# Patient Record
Sex: Male | Born: 1965 | Hispanic: Yes | Marital: Married | Smoking: Never smoker
Health system: Southern US, Community
[De-identification: ages and names within clinical notes are randomized; demographics above are authoritative.]

## PROBLEM LIST (undated history)

## (undated) DIAGNOSIS — E7800 Pure hypercholesterolemia, unspecified: Secondary | ICD-10-CM

## (undated) HISTORY — DX: Pure hypercholesterolemia, unspecified: E78.00

---

## 2013-08-13 ENCOUNTER — Emergency Department: Payer: Self-pay

## 2020-02-10 ENCOUNTER — Other Ambulatory Visit: Payer: Self-pay | Admitting: Orthopedic Surgery

## 2020-02-10 DIAGNOSIS — M4802 Spinal stenosis, cervical region: Secondary | ICD-10-CM

## 2020-02-10 DIAGNOSIS — M5030 Other cervical disc degeneration, unspecified cervical region: Secondary | ICD-10-CM

## 2020-03-12 ENCOUNTER — Ambulatory Visit
Admission: RE | Admit: 2020-03-12 | Discharge: 2020-03-12 | Disposition: A | Discharge status: Home / Self Care | Payer: No Typology Code available for payment source | Source: Ambulatory Visit | Attending: Orthopedic Surgery | Admitting: Orthopedic Surgery

## 2020-03-12 DIAGNOSIS — M5030 Other cervical disc degeneration, unspecified cervical region: Secondary | ICD-10-CM

## 2020-03-12 DIAGNOSIS — M4802 Spinal stenosis, cervical region: Secondary | ICD-10-CM

## 2020-03-12 MED ORDER — GADOBENATE DIMEGLUMINE 529 MG/ML IV SOLN: 20.0000 mL | Freq: Once | INTRAVENOUS | Status: DC | PRN

## 2020-03-12 MED ORDER — GADOBENATE DIMEGLUMINE 529 MG/ML IV SOLN
20.0000 mL | Freq: Once | INTRAVENOUS | Status: AC | PRN
  Administered 2020-03-12: 20 mL via INTRAVENOUS

## 2020-03-19 DIAGNOSIS — M4802 Spinal stenosis, cervical region: Secondary | ICD-10-CM | POA: Insufficient documentation

## 2020-03-19 DIAGNOSIS — D171 Benign lipomatous neoplasm of skin and subcutaneous tissue of trunk: Secondary | ICD-10-CM | POA: Insufficient documentation

## 2021-01-17 IMAGING — MR MR CERVICAL SPINE WO/W CM
5 of 8 series · 27 of 48 positions shown · IV contrast (multihance)
Comparison: 01/03/2020 cervical spine radiographs.

CLINICAL DATA: Right shoulder and arm pain for 8 months.

EXAM:
MRI CERVICAL SPINE WITHOUT AND WITH CONTRAST
TECHNIQUE: Multiplanar and multiecho pulse sequences of the cervical spine, to
include the craniocervical junction and cervicothoracic junction,
were obtained without and with intravenous contrast.
CONTRAST:  20mL MULTIHANCE GADOBENATE DIMEGLUMINE 529 MG/ML IV SOLN

[Series 5: T1 · sagittal · 3.0mm · 0.66mm/px · 3 of 13 slices shown (1 of 3)]
[im 1/13]
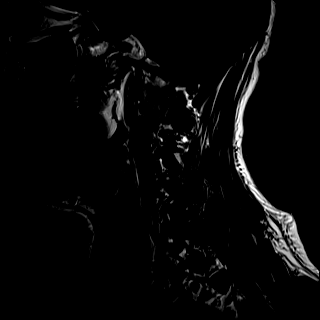
[im 7/13]
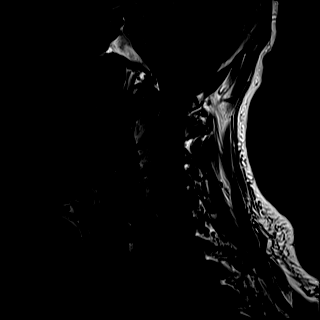
[im 13/13]
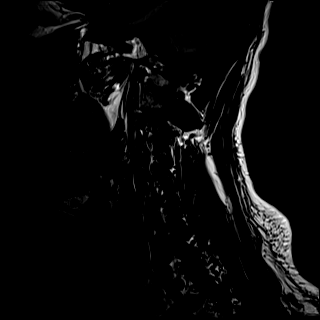

[Series 7: T2 · axial · 3.0mm · 0.50mm/px · z∈[-41,+62]mm · 9 of 34 slices shown (1 of 2)]
[im 1/34]
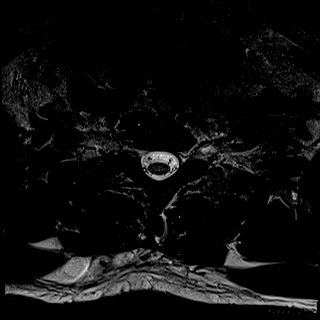
[im 5/34]
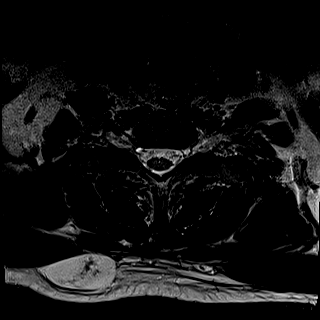
[im 9/34]
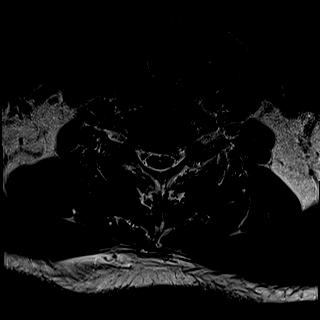
[im 13/34]
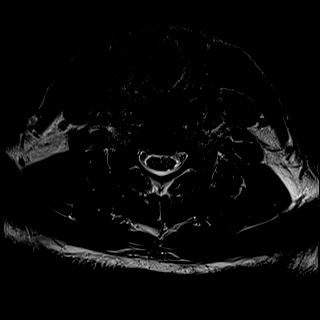
[im 17/34]
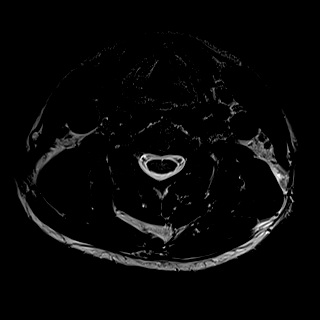
[im 21/34]
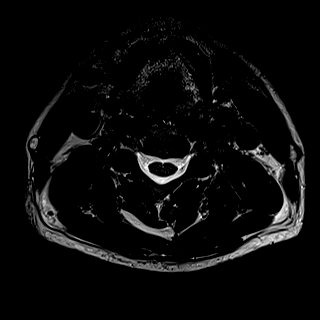
[im 25/34]
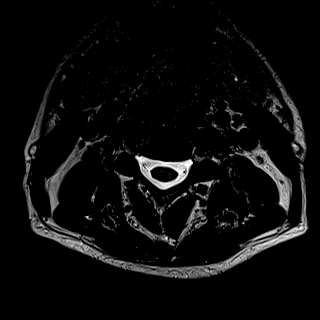
[im 29/34]
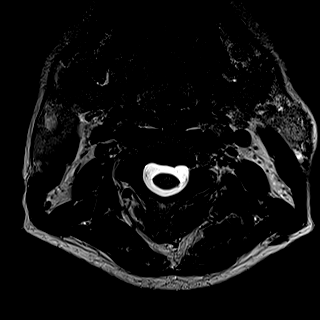
[im 34/34]
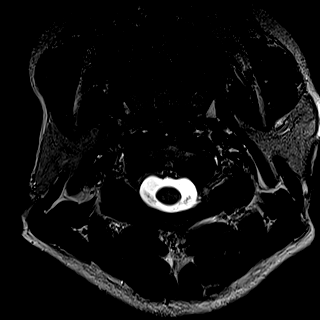

[Series 9: T1 · axial · non-contrast · 3.0mm · 0.31mm/px · z∈[-41,+62]mm · 9 of 34 slices shown (2 of 3)]
[im 1/34]
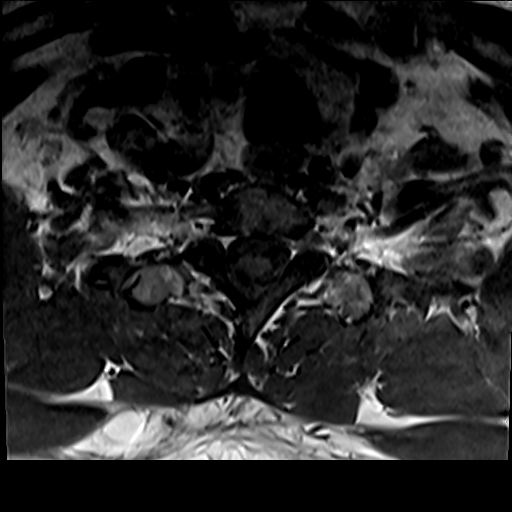
[im 5/34]
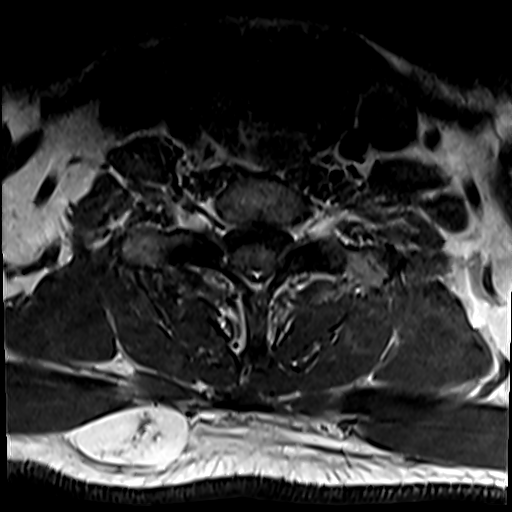
[im 9/34]
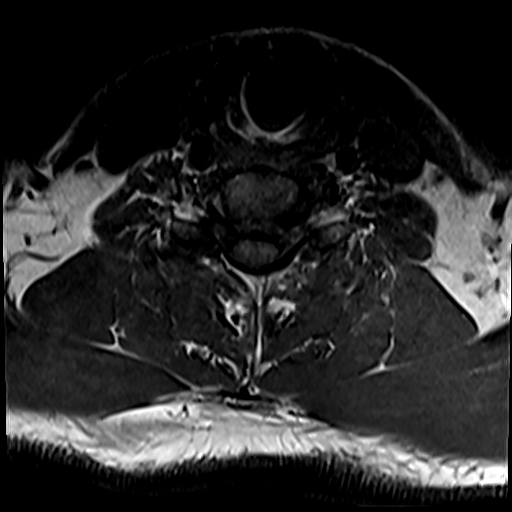
[im 13/34]
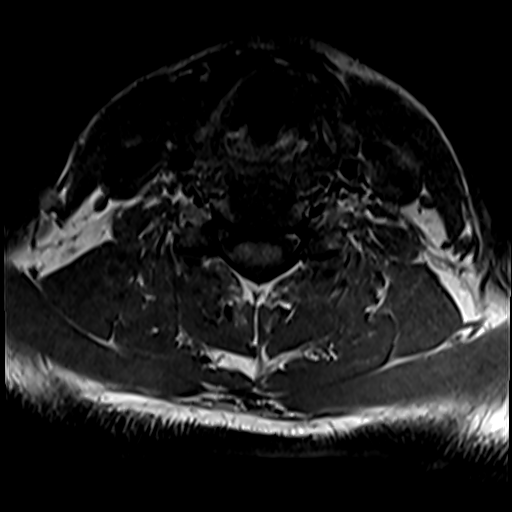
[im 17/34]
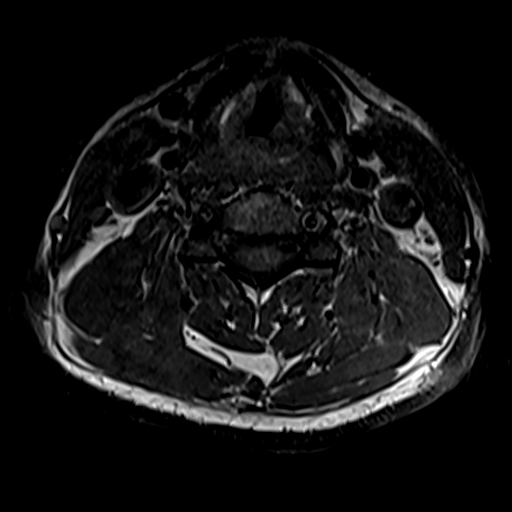
[im 21/34]
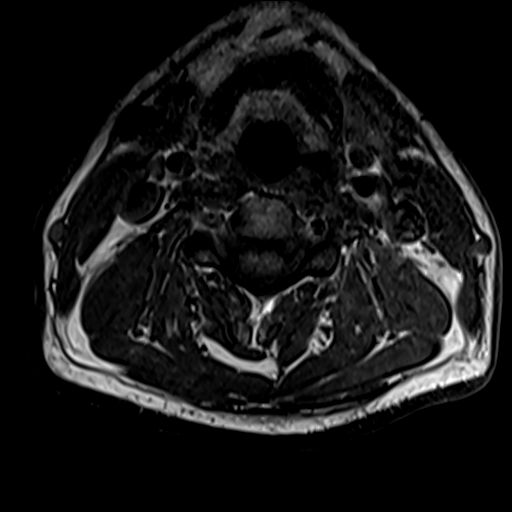
[im 25/34]
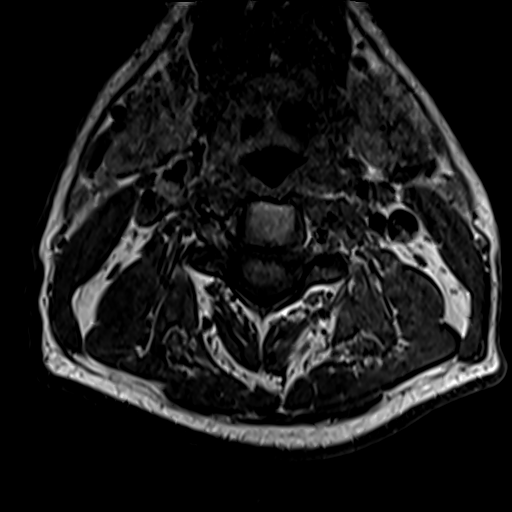
[im 29/34]
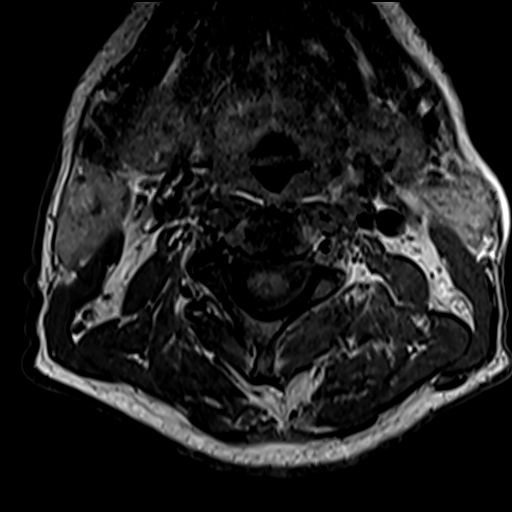
[im 34/34]
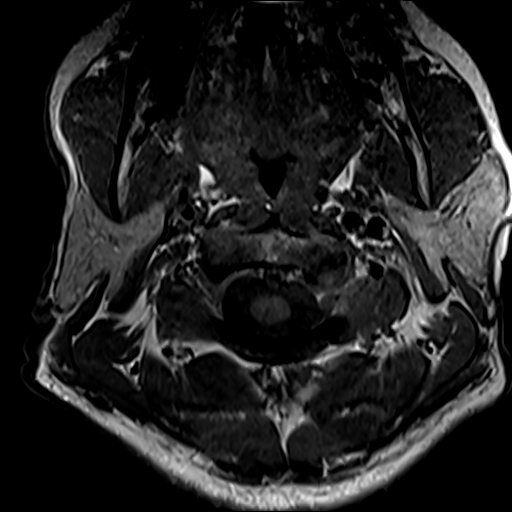

[Series 10: T2 · sagittal · 3.0mm · 0.55mm/px · 3 of 13 slices shown (2 of 2)]
[im 1/13]
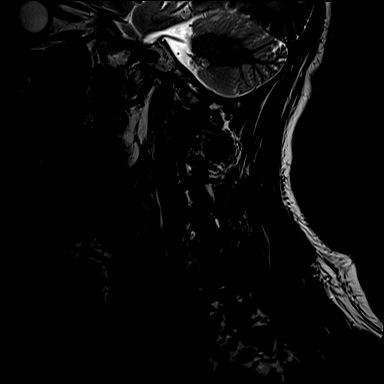
[im 7/13]
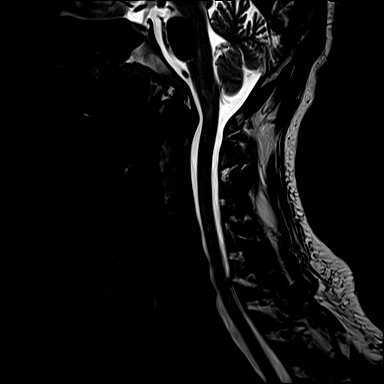
[im 13/13]
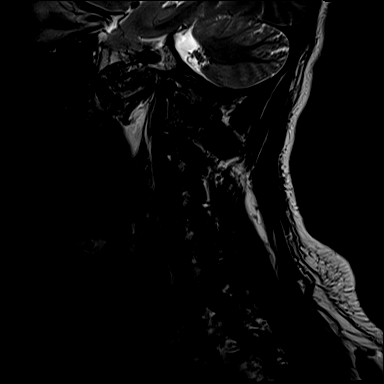

[Series 12: T1 · axial · 3.0mm · 0.31mm/px · z∈[-41,-16]mm · 3 of 34 slices shown (3 of 3)]
[im 1/34]
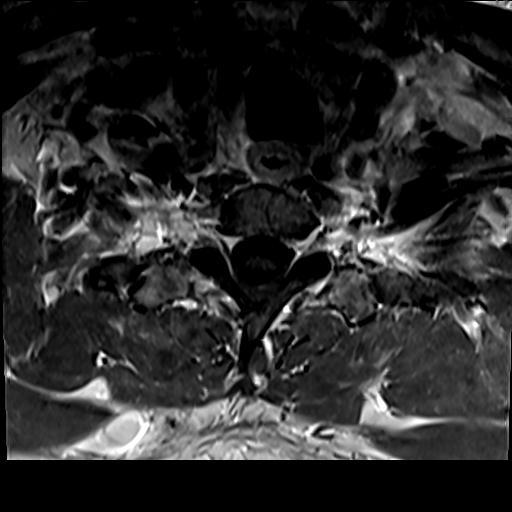
[im 5/34]
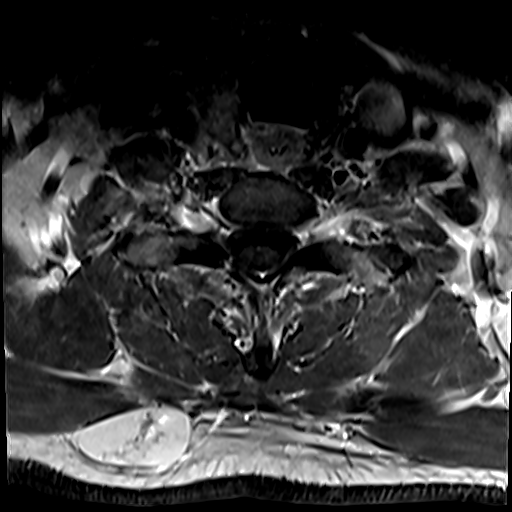
[im 9/34]
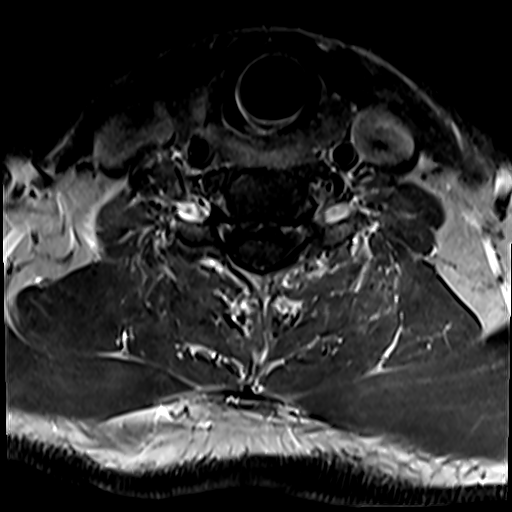

[27 of 48 positions shown; findings below may reference images not displayed]

FINDINGS: Alignment: Straightening of cervical lordosis.

Vertebrae: Normal bone marrow signal intensity. No focal osseous
lesion.

Cord: Normal signal and morphology.

Posterior Fossa, vertebral arteries: Negative.

Disc levels: Multilevel desiccation with mild disc space loss at the
C5-7 levels.

C2-3: No significant disc bulge, spinal canal or neural foraminal
narrowing.

C3-4: Disc osteophyte complex with superimposed central protrusion
and uncovertebral hypertrophy. No significant spinal canal or neural
foraminal narrowing.

C4-5: Disc osteophyte complex with superimposed central protrusion,
uncovertebral and bilateral facet hypertrophy. Mild spinal canal and
bilateral neural foraminal narrowing.

C5-6: Disc osteophyte complex with superimposed right paracentral
and subarticular protrusions with uncovertebral and bilateral facet
hypertrophy. Mild spinal canal, moderate right and mild left neural
foraminal narrowing.

C6-7: Disc osteophyte complex with small superimposed central
protrusion, uncovertebral and bilateral facet hypertrophy. Moderate
spinal canal and bilateral neural foraminal narrowing.

C7-T1: No significant disc bulge, spinal canal or neural foraminal
narrowing.

Paraspinal tissues: Dorsal right thoracic lipoma measuring 3.8 x
cm ([DATE]) at the C7-T1 level. Small nasopharyngeal retention cyst.
IMPRESSION: Multilevel spondylosis with moderate C6-7 and mild C4-6 spinal canal
narrowing.

Moderate right C5-6 and bilateral C6-7 neural foraminal narrowing.

## 2021-04-10 LAB — EXTERNAL GENERIC LAB PROCEDURE: COLOGUARD: POSITIVE — AB

## 2021-12-29 ENCOUNTER — Encounter: Payer: Self-pay | Admitting: Gastroenterology

## 2021-12-29 NOTE — H&P (Signed)
? ?Pre-Procedure H&P ? ?Patient ID: Nathan Conner is a 56 y.o. male. ? ?Gastroenterology Provider: Jaynie Collins, DO ? ?PCP: [facility] ? ?Date: 12/30/2021 ? ?HPI ?Nathan Conner is a 56 y.o. male who presents today for Colonoscopy for Positive Cologuard. ?Patient is native [place] speaking.  Information garnered through Spanish medical interpreter. ?Patient is endoscopy na?ve.  Was noted to have positive Cologuard in July 2022.  Reports BM 1-2 times daily without blood melena hematochezia diarrhea or constipation.  Has weight gain.  Appetite stable. ?Hemoglobin 16 MCV 84 platelets 245,000 creatinine 0.9 ?No family history of colorectal cancer or polyps ? ?History reviewed. No pertinent past medical history. ? ?History reviewed. No pertinent surgical history. ? ?Family History ?No h/o GI disease or malignancy ? ?Review of Systems  ?Constitutional:  Negative for activity change, appetite change, chills, diaphoresis, fatigue, fever and unexpected weight change.  ?HENT:  Negative for trouble swallowing and voice change.   ?Respiratory:  Negative for shortness of breath and wheezing.   ?Cardiovascular:  Negative for chest pain, palpitations and leg swelling.  ?Gastrointestinal:  Negative for abdominal distention, abdominal pain, anal bleeding, blood in stool, constipation, diarrhea, nausea and vomiting.  ?Musculoskeletal:  Negative for arthralgias and myalgias.  ?Skin:  Negative for color change and pallor.  ?Neurological:  Negative for dizziness, syncope and weakness.  ?Psychiatric/Behavioral:  Negative for confusion. The patient is not nervous/anxious.   ?All other systems reviewed and are negative.  ? ?Medications ?No current facility-administered medications on file prior to encounter.  ? ?Current Outpatient Medications on File Prior to Encounter  ?Medication Sig Dispense Refill  ? Ascorbic Acid (VITAMIN C) 1000 MG tablet Take 1,000 mg by mouth daily.    ? atorvastatin (LIPITOR)  40 MG tablet Take 40 mg by mouth daily.    ? ? ?Pertinent medications related to GI and procedure were reviewed by me with the patient prior to the procedure ? ? ?Current Facility-Administered Medications:  ?  0.9 %  sodium chloride infusion, , Intravenous, Continuous, Jaynie Collins, DO, Last Rate: 20 mL/hr at 12/30/21 0948, New Bag at 12/30/21 0948 ?  ?  ? ?No Known Allergies ?Allergies were reviewed by me prior to the procedure ? ?Objective  ? ?Body mass index is 27.47 kg/m?. ?Vitals:  ? 12/30/21 0931  ?BP: (!) 163/102  ?Pulse: 69  ?Resp: 15  ?Temp: (!) 97 ?F (36.1 ?C)  ?TempSrc: Temporal  ?SpO2: 100%  ?Weight: 84.4 kg  ?Height: 5\' 9"  (1.753 m)  ? ? ? ?Physical Exam ?Vitals and nursing note reviewed.  ?Constitutional:   ?   General: He is not in acute distress. ?   Appearance: Normal appearance. He is not ill-appearing, toxic-appearing or diaphoretic.  ?HENT:  ?   Head: Normocephalic and atraumatic.  ?   Nose: Nose normal.  ?   Mouth/Throat:  ?   Mouth: Mucous membranes are moist.  ?   Pharynx: Oropharynx is clear.  ?Eyes:  ?   General: No scleral icterus. ?   Extraocular Movements: Extraocular movements intact.  ?Cardiovascular:  ?   Rate and Rhythm: Normal rate and regular rhythm.  ?   Heart sounds: Normal heart sounds. No murmur heard. ?  No friction rub. No gallop.  ?Pulmonary:  ?   Effort: Pulmonary effort is normal. No respiratory distress.  ?   Breath sounds: Normal breath sounds. No wheezing, rhonchi or rales.  ?Abdominal:  ?   General: Bowel sounds are normal. There  is no distension.  ?   Palpations: Abdomen is soft.  ?   Tenderness: There is no abdominal tenderness. There is no guarding or rebound.  ?Musculoskeletal:  ?   Cervical back: Neck supple.  ?   Right lower leg: No edema.  ?   Left lower leg: No edema.  ?Skin: ?   General: Skin is warm and dry.  ?   Coloration: Skin is not jaundiced or pale.  ?Neurological:  ?   General: No focal deficit present.  ?   Mental Status: He is alert and  oriented to person, place, and time. Mental status is at baseline.  ?Psychiatric:     ?   Mood and Affect: Mood normal.     ?   Behavior: Behavior normal.     ?   Thought Content: Thought content normal.     ?   Judgment: Judgment normal.  ? ? ? ?Assessment:  ?Nathan Conner is a 56 y.o. male  who presents today for Colonoscopy for Positive Cologuard. ? ?Plan:  ?Colonoscopy with possible intervention today ? ?Colonoscopy with possible biopsy, control of bleeding, polypectomy, and interventions as necessary has been discussed with the patient/patient representative via interpreter. Informed consent was obtained from the patient/patient representative after explaining the indication, nature, and risks of the procedure including but not limited to death, bleeding, perforation, missed neoplasm/lesions, cardiorespiratory compromise, and reaction to medications. Opportunity for questions was given and appropriate answers were provided. Patient/patient representative has verbalized understanding is amenable to undergoing the procedure. ? ? ?Jaynie Collins, DO  ?[facility] ? ?Portions of the record may have been created with voice recognition software. Occasional wrong-word or 'sound-a-like' substitutions may have occurred due to the inherent limitations of voice recognition software.  Read the chart carefully and recognize, using context, where substitutions may have occurred. ?

## 2021-12-30 ENCOUNTER — Encounter: Payer: Self-pay | Admitting: Gastroenterology

## 2021-12-30 ENCOUNTER — Ambulatory Visit: Payer: No Typology Code available for payment source | Admitting: Certified Registered Nurse Anesthetist

## 2021-12-30 ENCOUNTER — Ambulatory Visit
Admission: RE | Admit: 2021-12-30 | Discharge: 2021-12-30 | Disposition: A | Discharge status: Home / Self Care | Payer: No Typology Code available for payment source | Attending: Gastroenterology | Admitting: Gastroenterology

## 2021-12-30 ENCOUNTER — Encounter
Admission: RE | Disposition: A | Discharge status: Home / Self Care | Payer: Self-pay | Source: Home / Self Care | Attending: Gastroenterology

## 2021-12-30 DIAGNOSIS — R195 Other fecal abnormalities: Secondary | ICD-10-CM | POA: Diagnosis not present

## 2021-12-30 DIAGNOSIS — K640 First degree hemorrhoids: Secondary | ICD-10-CM | POA: Diagnosis not present

## 2021-12-30 DIAGNOSIS — K529 Noninfective gastroenteritis and colitis, unspecified: Secondary | ICD-10-CM | POA: Insufficient documentation

## 2021-12-30 DIAGNOSIS — K635 Polyp of colon: Secondary | ICD-10-CM | POA: Insufficient documentation

## 2021-12-30 DIAGNOSIS — Z1211 Encounter for screening for malignant neoplasm of colon: Secondary | ICD-10-CM | POA: Insufficient documentation

## 2021-12-30 HISTORY — PX: COLONOSCOPY WITH PROPOFOL: SHX5780

## 2021-12-30 SURGERY — COLONOSCOPY WITH PROPOFOL
Anesthesia: General

## 2021-12-30 MED ORDER — PROPOFOL 500 MG/50ML IV EMUL
INTRAVENOUS | Status: DC | PRN
  Administered 2021-12-30: 150 ug/kg/min via INTRAVENOUS

## 2021-12-30 MED ORDER — SODIUM CHLORIDE 0.9 % IV SOLN: INTRAVENOUS | Status: DC

## 2021-12-30 MED ORDER — PROPOFOL 500 MG/50ML IV EMUL
INTRAVENOUS | Status: AC
  Filled 2021-12-30: qty 50

## 2021-12-30 MED ORDER — PROPOFOL 10 MG/ML IV BOLUS
INTRAVENOUS | Status: DC | PRN
Start: 2021-12-30 — End: 2021-12-30
  Administered 2021-12-30: 30 mg via INTRAVENOUS
  Administered 2021-12-30: 50 mg via INTRAVENOUS

## 2021-12-30 MED ORDER — LIDOCAINE HCL (CARDIAC) PF 100 MG/5ML IV SOSY
PREFILLED_SYRINGE | INTRAVENOUS | Status: DC | PRN
  Administered 2021-12-30: 50 mg via INTRAVENOUS

## 2021-12-30 MED ORDER — LIDOCAINE HCL (PF) 2 % IJ SOLN
INTRAMUSCULAR | Status: AC
  Filled 2021-12-30: qty 5

## 2021-12-30 NOTE — Anesthesia Procedure Notes (Signed)
Date/Time: 12/30/2021 9:59 AM ?Performed by: Ginger Carne, CRNA ?Pre-anesthesia Checklist: Patient identified, Emergency Drugs available, Suction available, Patient being monitored and Timeout performed ?Patient Re-evaluated:Patient Re-evaluated prior to induction ?Oxygen Delivery Method: Nasal cannula ?Preoxygenation: Pre-oxygenation with 100% oxygen ?Induction Type: IV induction ? ? ? ? ?

## 2021-12-30 NOTE — Anesthesia Preprocedure Evaluation (Signed)
Anesthesia Evaluation  ?Patient identified by MRN, date of birth, ID band ?Patient awake ? ? ? ?Reviewed: ?Allergy & Precautions, H&P , NPO status , Patient's Chart, lab work & pertinent test results, reviewed documented beta blocker date and time  ? ?History of Anesthesia Complications ?Negative for: history of anesthetic complications ? ?Airway ?Mallampati: II ? ?TM Distance: >3 FB ?Neck ROM: full ? ? ? Dental ? ?(+) Missing, Dental Advidsory Given, Teeth Intact ?  ?Pulmonary ?neg pulmonary ROS,  ?  ?Pulmonary exam normal ?breath sounds clear to auscultation ? ? ? ? ? ? Cardiovascular ?Exercise Tolerance: Good ?negative cardio ROS ?Normal cardiovascular exam ?Rhythm:regular Rate:Normal ? ? ?  ?Neuro/Psych ?negative neurological ROS ? negative psych ROS  ? GI/Hepatic ?negative GI ROS, Neg liver ROS,   ?Endo/Other  ?negative endocrine ROS ? Renal/GU ?negative Renal ROS  ?negative genitourinary ?  ?Musculoskeletal ? ? Abdominal ?  ?Peds ? Hematology ?negative hematology ROS ?(+)   ?Anesthesia Other Findings ?History reviewed. No pertinent past medical history. ? ? Reproductive/Obstetrics ?negative OB ROS ? ?  ? ? ? ? ? ? ? ? ? ? ? ? ? ?  ?  ? ? ? ? ? ? ? ? ?Anesthesia Physical ?Anesthesia Plan ? ?ASA: 2 ? ?Anesthesia Plan: General  ? ?Post-op Pain Management:   ? ?Induction: Intravenous ? ?PONV Risk Score and Plan: 2 and Propofol infusion and TIVA ? ?Airway Management Planned: Natural Airway and Nasal Cannula ? ?Additional Equipment:  ? ?Intra-op Plan:  ? ?Post-operative Plan:  ? ?Informed Consent: I have reviewed the patients History and Physical, chart, labs and discussed the procedure including the risks, benefits and alternatives for the proposed anesthesia with the patient or authorized representative who has indicated his/her understanding and acceptance.  ? ? ? ?Dental Advisory Given ? ?Plan Discussed with: Anesthesiologist, CRNA and Surgeon ? ?Anesthesia Plan Comments:    ? ? ? ? ? ? ?Anesthesia Quick Evaluation ? ?

## 2021-12-30 NOTE — Interval H&P Note (Signed)
History and Physical Interval Note: Preprocedure H&P from 12/30/21 ? was reviewed and there was no interval change after seeing and examining the patient.  Written consent was obtained from the patient after discussion of risks, benefits, and alternatives. Patient has consented to proceed with Colonoscopy with possible intervention ? ? ?12/30/2021 ?9:54 AM ? ?Freddy Kinne  has presented today for surgery, with the diagnosis of Abnormal stool finding ?Postive Cologuard.  The various methods of treatment have been discussed with the patient and family. After consideration of risks, benefits and other options for treatment, the patient has consented to  Procedure(s) with comments: ?COLONOSCOPY WITH PROPOFOL (N/A) - MID AM, SPANISH INTERPRETER as a surgical intervention.  The patient's history has been reviewed, patient examined, no change in status, stable for surgery.  I have reviewed the patient's chart and labs.  Questions were answered to the patient's satisfaction.   ? ? ?Jaynie Collins ? ? ?

## 2021-12-30 NOTE — Op Note (Signed)
[facility] ?Gastroenterology ?Patient Name: Nathan Conner ?Procedure Date: 12/30/2021 9:44 AM ?MRN: [id number] ?Account #: [id number] ?Date of Birth: 1966-06-08 ?Admit Type: Outpatient ?Age: 56 ?Room: [facility] ENDO ROOM 1 ?Gender: Male ?Note Status: Finalized ?Instrument Name: Colonoscope [id number] ?Procedure:             Colonoscopy ?Indications:           Positive Cologuard test ?Providers:             Annamaria Helling DO, DO ?Referring MD:          Dion Body (Referring MD) ?Medicines:             Monitored Anesthesia Care ?Complications:         No immediate complications. Estimated blood loss:  ?                       Minimal. ?Procedure:             Pre-Anesthesia Assessment: ?                       - Prior to the procedure, a History and Physical was  ?                       performed, and patient medications and allergies were  ?                       reviewed. The patient is competent. The risks and  ?                       benefits of the procedure and the sedation options and  ?                       risks were discussed with the patient. All questions  ?                       were answered and informed consent was obtained.  ?                       Patient identification and proposed procedure were  ?                       verified by the physician, the nurse, the anesthetist  ?                       and the technician in the endoscopy suite. Mental  ?                       Status Examination: alert and oriented. Airway  ?                       Examination: normal oropharyngeal airway and neck  ?                       mobility. Respiratory Examination: clear to  ?                       auscultation. CV Examination: RRR, no murmurs, no S3  ?  or S4. Prophylactic Antibiotics: The patient does not  ?                       require prophylactic antibiotics. Prior  ?                       Anticoagulants: The patient has taken no previous  ?                        anticoagulant or antiplatelet agents. ASA Grade  ?                       Assessment: II - A patient with mild systemic disease.  ?                       After reviewing the risks and benefits, the patient  ?                       was deemed in satisfactory condition to undergo the  ?                       procedure. The anesthesia plan was to use monitored  ?                       anesthesia care (MAC). Immediately prior to  ?                       administration of medications, the patient was  ?                       re-assessed for adequacy to receive sedatives. The  ?                       heart rate, respiratory rate, oxygen saturations,  ?                       blood pressure, adequacy of pulmonary ventilation, and  ?                       response to care were monitored throughout the  ?                       procedure. The physical status of the patient was  ?                       re-assessed after the procedure. ?                       After obtaining informed consent, the colonoscope was  ?                       passed under direct vision. Throughout the procedure,  ?                       the patient's blood pressure, pulse, and oxygen  ?                       saturations were monitored continuously. The  ?  Colonoscope was introduced through the anus and  ?                       advanced to the the terminal ileum, with  ?                       identification of the appendiceal orifice and IC  ?                       valve. The colonoscopy was performed without  ?                       difficulty. The patient tolerated the procedure well.  ?                       The quality of the bowel preparation was evaluated  ?                       using the BBPS [facility] Bowel Preparation Scale) with  ?                       scores of: Right Colon = 3, Transverse Colon = 3 and  ?                       Left Colon = 3 (entire mucosa seen well with no  ?                       residual staining,  small fragments of stool or opaque  ?                       liquid). The total BBPS score equals 9. The terminal  ?                       ileum, ileocecal valve, appendiceal orifice, and  ?                       rectum were photographed. ?Findings: ?     The perianal and digital rectal examinations were normal. Pertinent  ?     negatives include normal sphincter tone. ?     Non-bleeding internal hemorrhoids were found during retroflexion. The  ?     hemorrhoids were Grade I (internal hemorrhoids that do not prolapse).  ?     Estimated blood loss: none. ?     Five sessile polyps were found in the descending colon (3), transverse  ?     colon and ascending colon. The polyps were 1 to 2 mm in size. These  ?     polyps were removed with a jumbo cold forceps. Resection and retrieval  ?     were complete. Estimated blood loss was minimal. ?     Three sessile polyps were found in the sigmoid colon and ascending colon  ?     (2). The polyps were 2 to 3 mm in size. These polyps were removed with a  ?     cold snare. Resection and retrieval were complete. Estimated blood loss  ?     was minimal. ?     The exam was otherwise without abnormality on direct and retroflexion  ?  views. ?Impression:            - Non-bleeding internal hemorrhoids. ?                       - Five 1 to 2 mm polyps in the descending colon, in  ?                       the transverse colon and in the ascending colon,  ?                       removed with a jumbo cold forceps. Resected and  ?                       retrieved. ?                       - Three 2 to 3 mm polyps in the sigmoid colon and in  ?                       the ascending colon, removed with a cold snare.  ?                       Resected and retrieved. ?                       - The examination was otherwise normal on direct and  ?                       retroflexion views. ?Recommendation:        - Discharge patient to home. ?                       - Resume previous diet. ?                        - Continue present medications. ?                       - No aspirin, ibuprofen, naproxen, or other  ?                       non-steroidal anti-inflammatory drugs for 5 days after  ?                       polyp removal. ?                       - Await pathology results. ?                       - Repeat colonoscopy for surveillance based on  ?                       pathology results. ?                       - Return to referring physician as previously  ?                       scheduled. ?                       -  The findings and recommendations were discussed with  ?                       the patient. ?Procedure Code(s):     --- Professional --- ?                       [id number], Colonoscopy, flexible; with removal of  ?                       tumor(s), polyp(s), or other lesion(s) by snare  ?                       technique ?                       45380, 59, Colonoscopy, flexible; with biopsy, single  ?                       or multiple ?Diagnosis Code(s):     --- Professional --- ?                       K63.5, Polyp of colon ?                       K64.0, First degree hemorrhoids ?                       R19.5, Other fecal abnormalities ?CPT copyright 2019 American Medical Association. All rights reserved. ?The codes documented in this report are preliminary and upon coder review may  ?be revised to meet current compliance requirements. ?Attending Participation: ?     I personally performed the entire procedure. ?Volney American, DO ?Annamaria Helling DO, DO ?12/30/2021 10:39:36 AM ?This report has been signed electronically. ?Number of Addenda: 0 ?Note Initiated On: 12/30/2021 9:44 AM ?Scope Withdrawal Time: 0 hours 25 minutes 0 seconds  ?Total Procedure Duration: 0 hours 28 minutes 54 seconds  ?Estimated Blood Loss:  Estimated blood loss was minimal. ?     [facility] ?

## 2021-12-30 NOTE — Transfer of Care (Addendum)
Immediate Anesthesia Transfer of Care Note ? ?Patient: Nathan Conner ? ?Procedure(s) Performed: COLONOSCOPY WITH PROPOFOL ? ?Patient Location: PACU ? ?Anesthesia Type:General ? ?Level of Consciousness: sedated ? ?Airway & Oxygen Therapy: Patient Spontanous Breathing ? ?Post-op Assessment: Report given to RN ? ?Post vital signs: Reviewed and stable ? ?Last Vitals:  ?Vitals Value Taken Time  ?BP    ?Temp    ?Pulse    ?Resp    ?SpO2    ? ? ?Last Pain:  ?Vitals:  ? 12/30/21 0931  ?TempSrc: Temporal  ?PainSc: 0-No pain  ?   ? ?  ? ?Complications: No notable events documented. ?

## 2021-12-30 NOTE — Anesthesia Postprocedure Evaluation (Signed)
Anesthesia Post Note ? ?Patient: Nathan Conner ? ?Procedure(s) Performed: COLONOSCOPY WITH PROPOFOL ? ?Patient location during evaluation: Endoscopy ?Anesthesia Type: General ?Level of consciousness: awake and alert ?Pain management: pain level controlled ?Vital Signs Assessment: post-procedure vital signs reviewed and stable ?Respiratory status: spontaneous breathing, nonlabored ventilation, respiratory function stable and patient connected to nasal cannula oxygen ?Cardiovascular status: blood pressure returned to baseline and stable ?Postop Assessment: no apparent nausea or vomiting ?Anesthetic complications: no ? ? ?No notable events documented. ? ? ?Last Vitals:  ?Vitals:  ? 12/30/21 1041 12/30/21 1043  ?BP: 100/76 100/67  ?Pulse: (!) 54   ?Resp: 10   ?Temp:    ?SpO2: 100%   ?  ?Last Pain:  ?Vitals:  ? 12/30/21 1116  ?TempSrc:   ?PainSc: 0-No pain  ? ? ?  ?  ?  ?  ?  ?  ? ?Lenard Simmer ? ? ? ? ?

## 2022-01-02 ENCOUNTER — Encounter: Payer: Self-pay | Admitting: Gastroenterology

## 2022-01-02 LAB — SURGICAL PATHOLOGY

## 2023-06-13 ENCOUNTER — Ambulatory Visit: Payer: 59

## 2023-06-13 ENCOUNTER — Telehealth: Payer: Self-pay

## 2023-06-13 VITALS — Wt 200.0 lb

## 2023-06-13 DIAGNOSIS — Z111 Encounter for screening for respiratory tuberculosis: Secondary | ICD-10-CM

## 2023-06-13 NOTE — Progress Notes (Signed)
EPI completed via phone after patient contacted Dr. Lind Guest at [facility] concerned about TB test and CXR results from immigration, Dr. Ardyth Harps.  TB RN has left message for [facility] in [place] for results [phone]. Patient denies TB sx's or any known exposure. In the [place] 20[place] years. [name], RN

## 2023-06-13 NOTE — Telephone Encounter (Signed)
TC with patient. Reports had tb test with immigration, Dr. Ardyth Harps and CXR yesterday. Concerned about results.  Patient called PCP at [facility] called TB RN. TB has left message at [facility] in [place] [phone] for results and CXR report.[name], RN   TB RN will go ahead and complete EPI and addend as needed once [facility] receives results. Patient given TB RN contact info and educated re: Active TB vs LTBI [name], RN

## 2023-06-14 ENCOUNTER — Telehealth: Payer: Self-pay

## 2023-06-14 NOTE — Progress Notes (Signed)
TC from [place]. Informed her that I am waiting on the CXR and patient denies TB sx's. [facility] will most likely offer LTBI tx. Discussed Active TB vs LTBI.[name], RN

## 2023-06-14 NOTE — Telephone Encounter (Signed)
TC from [facility] re: patient TB test and CXR. Patient QFT was positive and CXR report from 06/12/23 still pending.  States reports should be signed off by Monday. Will fax lab and CXR once Dr signs off. [name], RN    Message left for Vernona Rieger, Dr. Prudencio Pair nurse, to call TB RN back re: this info above. 10:50am [name], RN

## 2023-06-20 ENCOUNTER — Telehealth: Payer: Self-pay

## 2023-06-20 NOTE — Telephone Encounter (Signed)
TC to [facility] in [place].  CXR report is still not ready.  Nurse will be back in Friday per receptionist. [name], RN

## 2023-06-22 ENCOUNTER — Telehealth: Payer: Self-pay

## 2023-06-22 NOTE — Telephone Encounter (Signed)
LTBI start appt 06/28/23

## 2023-06-26 ENCOUNTER — Other Ambulatory Visit: Payer: Self-pay

## 2023-06-26 DIAGNOSIS — R7612 Nonspecific reaction to cell mediated immunity measurement of gamma interferon antigen response without active tuberculosis: Secondary | ICD-10-CM | POA: Insufficient documentation

## 2023-06-26 NOTE — Progress Notes (Signed)
Tuberculosis treatment orders  All patients are to be monitored per [place] and county TB policies.   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 Treat for latent TB per the following:  Rifapentine 150mg  (6 tablets) by mouth once weekly x 12 weeks Isoniazid 300mg  (3 tablets) by mouth once weekly x 12 weeks   CBC/CMP on 06/18/23 [facility] No monthly LFTs necessary at this time. Offer HIV at TB med start appt.  EPI 06/13/23 +QFT 06/05/23 [facility] Negative CXR 06/12/23 [facility] Will send documents for scanning

## 2023-06-28 ENCOUNTER — Ambulatory Visit: Payer: 59

## 2023-06-28 VITALS — Wt 197.0 lb

## 2023-06-28 DIAGNOSIS — R7612 Nonspecific reaction to cell mediated immunity measurement of gamma interferon antigen response without active tuberculosis: Secondary | ICD-10-CM

## 2023-06-28 LAB — HM HIV SCREENING LAB: HM HIV Screening: NEGATIVE

## 2023-06-28 MED ORDER — RIFAPENTINE 150 MG PO TABS: 900.0000 mg | ORAL_TABLET | ORAL | Status: AC

## 2023-06-28 MED ORDER — ISONIAZID 300 MG PO TABS: 900.0000 mg | ORAL_TABLET | ORAL | Status: AC

## 2023-06-28 NOTE — Progress Notes (Addendum)
Patient's PCP has started him on a statin drug 06/19/2023 and he began taking this medication 06/25/2023. Due to this medication addition patient will need monthly LFTs. Draw baseline labs (CBC/LFT) at start appt.

## 2023-06-28 NOTE — Progress Notes (Signed)
Patient in clinic for LTBI tx start. Given 1 month supply of weekly INH/Rifapentine.  States recently went to PCP and was prescribed statin drug and started taking med this week. Sent to lab for CBC/LFTs/HIV. TB med orders appended. [name], RN

## 2023-06-29 LAB — CBC WITH DIFFERENTIAL/PLATELET
Basophils Absolute: 0.1 10*3/uL (ref 0.0–0.2)
Basos: 1 %
EOS (ABSOLUTE): 0.5 10*3/uL — ABNORMAL HIGH (ref 0.0–0.4)
Eos: 6 %
Hematocrit: 48 % (ref 37.5–51.0)
Hemoglobin: 15.5 g/dL (ref 13.0–17.7)
Immature Grans (Abs): 0 10*3/uL (ref 0.0–0.1)
Immature Granulocytes: 0 %
Lymphocytes Absolute: 2.3 10*3/uL (ref 0.7–3.1)
Lymphs: 29 %
MCH: 28.2 pg (ref 26.6–33.0)
MCHC: 32.3 g/dL (ref 31.5–35.7)
MCV: 87 fL (ref 79–97)
Monocytes Absolute: 0.8 10*3/uL (ref 0.1–0.9)
Monocytes: 10 %
Neutrophils Absolute: 4.5 10*3/uL (ref 1.4–7.0)
Neutrophils: 54 %
Platelets: 271 10*3/uL (ref 150–450)
RBC: 5.5 x10E6/uL (ref 4.14–5.80)
RDW: 12.7 % (ref 11.6–15.4)
WBC: 8.1 10*3/uL (ref 3.4–10.8)

## 2023-06-29 LAB — HEPATIC FUNCTION PANEL
ALT: 17 [IU]/L (ref 0–44)
AST: 18 [IU]/L (ref 0–40)
Albumin: 4.6 g/dL (ref 3.8–4.9)
Alkaline Phosphatase: 110 [IU]/L (ref 44–121)
Bilirubin Total: 0.5 mg/dL (ref 0.0–1.2)
Bilirubin, Direct: 0.14 mg/dL (ref 0.00–0.40)
Total Protein: 7.2 g/dL (ref 6.0–8.5)

## 2023-07-27 ENCOUNTER — Ambulatory Visit: Payer: 59

## 2023-07-27 VITALS — Wt 195.5 lb

## 2023-07-27 DIAGNOSIS — R7612 Nonspecific reaction to cell mediated immunity measurement of gamma interferon antigen response without active tuberculosis: Secondary | ICD-10-CM

## 2023-07-27 MED ORDER — ISONIAZID 300 MG PO TABS: 900.0000 mg | ORAL_TABLET | ORAL | Status: AC

## 2023-07-27 MED ORDER — RIFAPENTINE 150 MG PO TABS: 900.0000 mg | ORAL_TABLET | ORAL | Status: AC

## 2023-07-27 NOTE — Progress Notes (Signed)
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 Takes once weekly on Friday's.  Denies Missing any pills  Denies Changes in  Meds  New Health problems:  Reports mild shoulder pain after takes TB meds which lasts few hours then goes away.   Also reports itching of  testicles which started one week after starting TB meds. Notices itching every day.   Consult with Dr Wyvonnia Lora , informed of patient status. Continue with LTBI treatment.  Advises patient to have itching evaluated by PCP.   Alcohol: denies. Advised to abstain from alcohol as TB meds can damage the liver.   The patient was dispensed INH 300 mg (#12 tabs) today per order by Dr Levonne Hubert. Instructions to take #3 pills (900mg ) by mouth once weekly x 4 weeks. I provided counseling today regarding the medication. We discussed the medication, the side effects and when to call clinic. Patient given the opportunity to ask questions. Questions answered.    The patient was dispensed Rifapentine 150 mg (#24 pills) today per order by Dr Levonne Hubert. Instructions to take #6 pills  (900 mg) by mouth once weekly x 4 weeks. I provided counseling today regarding the medication. We discussed the medication, the side effects and when to call clinic. Patient given the opportunity to ask questions. Questions answered.    TB contact card given and advised to contact with questions, concerns, side effects.    Next TB med appt scheduled for 08/24/2023 at 3:45 pm. Appt card given.   RN walked patient to lab for LFT's  Jerel Shepherd, RN

## 2023-07-28 LAB — HEPATIC FUNCTION PANEL
ALT: 20 [IU]/L (ref 0–44)
AST: 20 [IU]/L (ref 0–40)
Albumin: 4.6 g/dL (ref 3.8–4.9)
Alkaline Phosphatase: 140 [IU]/L — ABNORMAL HIGH (ref 44–121)
Bilirubin Total: 0.3 mg/dL (ref 0.0–1.2)
Bilirubin, Direct: 0.11 mg/dL (ref 0.00–0.40)
Total Protein: 7.6 g/dL (ref 6.0–8.5)

## 2023-07-29 NOTE — Progress Notes (Signed)
MD Attestation for TB RN: I agree with the care provided to this patient and the plan for follow up and treatment.  Avyan Livesay M. Tish Begin, MD  

## 2023-07-30 ENCOUNTER — Telehealth: Payer: Self-pay

## 2023-07-30 NOTE — Telephone Encounter (Signed)
TC to patient. Discussed elevation in AlkP. Patient reports itching is better. Stressed importance of calling [facility] TB RN if itching or shoulder discomfort increase or any GI symptoms. Patient verbalized understanding [name], RN   Language line

## 2023-08-24 ENCOUNTER — Ambulatory Visit (LOCAL_COMMUNITY_HEALTH_CENTER): Payer: Self-pay

## 2023-08-24 VITALS — Wt 200.5 lb

## 2023-08-24 DIAGNOSIS — R7612 Nonspecific reaction to cell mediated immunity measurement of gamma interferon antigen response without active tuberculosis: Secondary | ICD-10-CM

## 2023-08-24 MED ORDER — ISONIAZID 300 MG PO TABS: 900.0000 mg | ORAL_TABLET | ORAL | Status: AC

## 2023-08-24 MED ORDER — RIFAPENTINE 150 MG PO TABS: 900.0000 mg | ORAL_TABLET | ORAL | Status: AC

## 2023-08-24 NOTE — Progress Notes (Signed)
In nurse clinic for LTBI / TB Med Completion/ INH/Rifapentine #3/ Labs: LFT's Interpreter declines interpreter today. Speaks and understands English  Taking INH/Rifapentine as prescribed. Takes meds once weekly on Friday.  Denies Missing any pills  Changes in  Meds: Takes Atorvastatin only. States he is Not taking Amlodipine.  Denies taking any other meds  New Health problems: None No complaints today. States itching and shoulder pain have resolved.   Alcohol: denies. Advised to abstain from alcohol as Rifampin can damage the liver.   The patient was dispensed INH 300 mg (#12 tabs) today per order by Dr Levonne Hubert. Instructions to take #3 pills (900mg ) by mouth once weekly x 4 weeks. I provided counseling today regarding the medication. We discussed the medication, the side effects and when to call clinic. Patient given the opportunity to ask questions. Questions answered.    The patient was dispensed Rifapentine 150mg  (#24 pills) today per order by Dr Levonne Hubert Instructions to take #6 pills (900 mg) by mouth once weekly x 4 weeks. I provided counseling today regarding the medication. We discussed the medication, the side effects and when to call clinic. Patient given the opportunity to ask questions. Questions answered.    TB contact card given and advised to contact with questions, concerns, side effects.  TB completion card given and explained.  Patient TB completion letter given (English and [place]) and explained.   Patient gives permission for TB completion letter to be sent to PCP at [facility].  PCP letter successfully faxed to PCP: Dr Enid Baas [facility], fax [phone])   RN walked patient to lab for LFT's. Jerel Shepherd, RN

## 2023-08-25 LAB — HEPATIC FUNCTION PANEL
ALT: 25 [IU]/L (ref 0–44)
AST: 27 [IU]/L (ref 0–40)
Albumin: 4.5 g/dL (ref 3.8–4.9)
Alkaline Phosphatase: 129 [IU]/L — ABNORMAL HIGH (ref 44–121)
Bilirubin Total: 0.3 mg/dL (ref 0.0–1.2)
Bilirubin, Direct: 0.13 mg/dL (ref 0.00–0.40)
Total Protein: 7.2 g/dL (ref 6.0–8.5)

## 2023-08-25 LAB — SPECIMEN STATUS REPORT

## 2024-06-09 ENCOUNTER — Ambulatory Visit: Payer: Self-pay | Attending: Physician Assistant

## 2024-06-09 ENCOUNTER — Other Ambulatory Visit: Payer: Self-pay | Admitting: Physician Assistant

## 2024-06-09 DIAGNOSIS — M7989 Other specified soft tissue disorders: Secondary | ICD-10-CM

## 2024-06-16 ENCOUNTER — Ambulatory Visit
Admission: RE | Admit: 2024-06-16 | Discharge: 2024-06-16 | Disposition: A | Discharge status: Home / Self Care | Payer: Self-pay | Source: Ambulatory Visit | Attending: Physician Assistant | Admitting: Physician Assistant

## 2024-06-16 DIAGNOSIS — M7989 Other specified soft tissue disorders: Secondary | ICD-10-CM | POA: Diagnosis present
